# Patient Record
Sex: Male | Born: 2005 | Race: White | Hispanic: Yes | Marital: Single | Smoking: Never smoker
Health system: Southern US, Community
[De-identification: ages and names within clinical notes are randomized; demographics above are authoritative.]

## PROBLEM LIST (undated history)

## (undated) DIAGNOSIS — F909 Attention-deficit hyperactivity disorder, unspecified type: Secondary | ICD-10-CM

## (undated) DIAGNOSIS — J45909 Unspecified asthma, uncomplicated: Secondary | ICD-10-CM

---
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
## 2024-01-10 ENCOUNTER — Other Ambulatory Visit: Payer: Self-pay | Admitting: Student

## 2024-01-10 DIAGNOSIS — R202 Paresthesia of skin: Secondary | ICD-10-CM

## 2024-01-10 DIAGNOSIS — H53149 Visual discomfort, unspecified: Secondary | ICD-10-CM

## 2024-01-10 DIAGNOSIS — R519 Headache, unspecified: Secondary | ICD-10-CM

## 2024-01-13 ENCOUNTER — Ambulatory Visit
Admission: RE | Admit: 2024-01-13 | Discharge: 2024-01-13 | Disposition: A | Discharge status: Home / Self Care | Source: Ambulatory Visit | Attending: Student | Admitting: Student

## 2024-01-13 DIAGNOSIS — R200 Anesthesia of skin: Secondary | ICD-10-CM | POA: Insufficient documentation

## 2024-01-13 DIAGNOSIS — H53149 Visual discomfort, unspecified: Secondary | ICD-10-CM | POA: Insufficient documentation

## 2024-01-13 DIAGNOSIS — R519 Headache, unspecified: Secondary | ICD-10-CM | POA: Insufficient documentation

## 2024-01-13 DIAGNOSIS — R202 Paresthesia of skin: Secondary | ICD-10-CM | POA: Insufficient documentation
